# Patient Record
Sex: Male | Born: 1990 | Race: White | Hispanic: No | Marital: Married | Smoking: Never smoker
Health system: Southern US, Community
[De-identification: ages and names within clinical notes are randomized; demographics above are authoritative.]

## PROBLEM LIST (undated history)

## (undated) DIAGNOSIS — F988 Other specified behavioral and emotional disorders with onset usually occurring in childhood and adolescence: Secondary | ICD-10-CM

## (undated) HISTORY — DX: Other specified behavioral and emotional disorders with onset usually occurring in childhood and adolescence: F98.8

---

## 2013-07-04 ENCOUNTER — Ambulatory Visit: Payer: Self-pay | Admitting: Nurse Practitioner

## 2013-07-04 ENCOUNTER — Telehealth: Payer: Self-pay | Admitting: *Deleted

## 2013-07-04 NOTE — Telephone Encounter (Signed)
Appt given for tomorrow

## 2013-07-05 ENCOUNTER — Ambulatory Visit (INDEPENDENT_AMBULATORY_CARE_PROVIDER_SITE_OTHER): Payer: BC Managed Care – PPO | Admitting: Nurse Practitioner

## 2013-07-05 ENCOUNTER — Encounter: Payer: Self-pay | Admitting: Nurse Practitioner

## 2013-07-05 VITALS — BP 138/76 | HR 60 | Temp 98.6°F | Ht 72.0 in | Wt 234.0 lb

## 2013-07-05 DIAGNOSIS — K219 Gastro-esophageal reflux disease without esophagitis: Secondary | ICD-10-CM

## 2013-07-05 DIAGNOSIS — F411 Generalized anxiety disorder: Secondary | ICD-10-CM

## 2013-07-05 MED ORDER — OMEPRAZOLE 40 MG PO CPDR: 40.0000 mg | DELAYED_RELEASE_CAPSULE | Freq: Every day | ORAL | Status: AC

## 2013-07-05 MED ORDER — CITALOPRAM HYDROBROMIDE 20 MG PO TABS: 20.0000 mg | ORAL_TABLET | Freq: Every day | ORAL | Status: AC

## 2013-07-05 NOTE — Patient Instructions (Signed)
Stress Management Stress is a state of physical or mental tension that often results from changes in your life or normal routine. Some common causes of stress are:  Death of a loved one.  Injuries or severe illnesses.  Getting fired or changing jobs.  Moving into a new home. Other causes may be:  Sexual problems.  Business or financial losses.  Taking on a large debt.  Regular conflict with someone at home or at work.  Constant tiredness from lack of sleep. It is not just bad things that are stressful. It may be stressful to:  Win the lottery.  Get married.  Buy a new car. The amount of stress that can be easily tolerated varies from person to person. Changes generally cause stress, regardless of the types of change. Too much stress can affect your health. It may lead to physical or emotional problems. Too little stress (boredom) may also become stressful. SUGGESTIONS TO REDUCE STRESS:  Talk things over with your family and friends. It often is helpful to share your concerns and worries. If you feel your problem is serious, you may want to get help from a professional counselor.  Consider your problems one at a time instead of lumping them all together. Trying to take care of everything at once may seem impossible. List all the things you need to do and then start with the most important one. Set a goal to accomplish 2 or 3 things each day. If you expect to do too many in a single day you will naturally fail, causing you to feel even more stressed.  Do not use alcohol or drugs to relieve stress. Although you may feel better for a short time, they do not remove the problems that caused the stress. They can also be habit forming.  Exercise regularly - at least 3 times per week. Physical exercise can help to relieve that "uptight" feeling and will relax you.  The shortest distance between despair and hope is often a good night's sleep.  Go to bed and get up on time allowing  yourself time for appointments without being rushed.  Take a short "time-out" period from any stressful situation that occurs during the day. Close your eyes and take some deep breaths. Starting with the muscles in your face, tense them, hold it for a few seconds, then relax. Repeat this with the muscles in your neck, shoulders, hand, stomach, back and legs.  Take good care of yourself. Eat a balanced diet and get plenty of rest.  Schedule time for having fun. Take a break from your daily routine to relax. HOME CARE INSTRUCTIONS   Call if you feel overwhelmed by your problems and feel you can no longer manage them on your own.  Return immediately if you feel like hurting yourself or someone else. Document Released: 12/03/2000 Document Revised: 09/01/2011 Document Reviewed: 02/01/2013 [employer] Patient Information 2014 [employer].  

## 2013-07-05 NOTE — Progress Notes (Signed)
   Subjective:    Patient ID: Jim ClossCody Dennis, male    DOB: 08/03/1990, 23 y.o.   MRN: [id number]  HPI Patient here today to discuss several things: - acid reflux- started couple of weeks ago- happens everyday- wakes him up at night- lots of belching. No nausea or vomiting. - Patient says that his stress level is "out the roof". Says that it has changed his attitude and he angry easily and worries a lot more then he use to. Thinks that a lot of it is job related- it has caused problems with him falling asleep.     Review of Systems  Constitutional: Negative.   HENT: Negative.   Respiratory: Negative.   Cardiovascular: Negative.   Gastrointestinal: Negative.   Genitourinary: Negative.   Musculoskeletal: Negative.   All other systems reviewed and are negative.       Objective:   Physical Exam  Constitutional: He appears well-developed and well-nourished.  Cardiovascular: Normal rate, regular rhythm and normal heart sounds.   Pulmonary/Chest: Effort normal and breath sounds normal.  Musculoskeletal: Normal range of motion.  Skin: Skin is warm.  Psychiatric: He has a normal mood and affect. His behavior is normal. Judgment and thought content normal.   BP 138/76  Pulse 60  Temp(Src) 98.6 F (37 C) (Oral)  Ht 6' (1.829 m)  Wt 234 lb (106.142 kg)  BMI 31.73 kg/m2        Assessment & Plan:   1. GERD (gastroesophageal reflux disease)   2. GAD (generalized anxiety disorder)    Meds ordered this encounter  Medications  . omeprazole (PRILOSEC) 40 MG capsule    Sig: Take 1 capsule (40 mg total) by mouth daily.    Dispense:  30 capsule    Refill:  3    Order Specific Question:  Supervising Provider    Answer:  Ernestina PennaMOORE, DONALD W [[id number]]  . citalopram (CELEXA) 20 MG tablet    Sig: Take 1 tablet (20 mg total) by mouth daily.    Dispense:  30 tablet    Refill:  3    Order Specific Question:  Supervising Provider    Answer:  Ernestina PennaMOORE, DONALD W [[id number]]   Avoid spicy and fatty foods  for several days Do no eat 2 hours prior to bedtime Do not over eat Exercise Medication side effects discussed  Mary-Margaret Daphine DeutscherMartin, FNP

## 2013-07-05 NOTE — Addendum Note (Signed)
Addended by: Bennie PieriniMARTIN, MARY-MARGARET on: 07/05/2013 02:31 PM   Modules accepted: Orders

## 2013-07-07 ENCOUNTER — Telehealth: Payer: Self-pay | Admitting: Family Medicine

## 2013-07-07 LAB — H PYLORI, IGM, IGG, IGA AB
H Pylori IgG: <0.9 U/mL (ref 0.0–0.8)
H. pylori, IgA Abs: <9 units (ref 0.0–8.9)
H. pylori, IgM Abs: <9 units (ref 0.0–8.9)

## 2013-07-07 NOTE — Telephone Encounter (Signed)
Message copied by Azalee CourseFULP, ASHLEY on Thu Jul 07, 2013 11:49 AM ------      Message from: Bennie PieriniMARTIN, MARY-MARGARET      Created: Thu Jul 07, 2013  9:46 AM       h pylori negative ------

## 2014-12-08 DIAGNOSIS — F988 Other specified behavioral and emotional disorders with onset usually occurring in childhood and adolescence: Secondary | ICD-10-CM

## 2014-12-08 HISTORY — DX: Other specified behavioral and emotional disorders with onset usually occurring in childhood and adolescence: F98.8

## 2018-04-12 ENCOUNTER — Encounter: Payer: Self-pay | Admitting: Family Medicine

## 2018-04-15 ENCOUNTER — Encounter: Payer: Self-pay | Admitting: Family Medicine

## 2018-04-15 ENCOUNTER — Ambulatory Visit (INDEPENDENT_AMBULATORY_CARE_PROVIDER_SITE_OTHER): Payer: BC Managed Care – PPO

## 2018-04-15 ENCOUNTER — Telehealth: Payer: Self-pay | Admitting: Family Medicine

## 2018-04-15 ENCOUNTER — Ambulatory Visit (INDEPENDENT_AMBULATORY_CARE_PROVIDER_SITE_OTHER): Payer: BC Managed Care – PPO | Admitting: Family Medicine

## 2018-04-15 VITALS — BP 140/79 | HR 66 | Ht 72.0 in | Wt 245.0 lb

## 2018-04-15 DIAGNOSIS — M25511 Pain in right shoulder: Secondary | ICD-10-CM

## 2018-04-15 MED ORDER — DICLOFENAC SODIUM 1 % TD GEL: 4.0000 g | Freq: Four times a day (QID) | TRANSDERMAL | 11 refills | Status: AC

## 2018-04-15 NOTE — Patient Instructions (Addendum)
Thank you for coming in today. Attend PT.  You should hear from them soon.  Do the band exercises  Up to the front, up to the side, internal and external rotation 30 reps 2x daily.   Use diclofenac gel 4x daily for pain as needed.

## 2018-04-15 NOTE — Telephone Encounter (Signed)
Received fax from [facility] that Diclofenac Gel has been approved from 04-15-2018 through 04-15-2021. Form sent to scan. Pharmacy aware.

## 2018-04-15 NOTE — Progress Notes (Signed)
Jim Dennis is a 27 y.o. male who presents to [facility] today for right shoulder pain. Mr. Jim Dennis has had gradually worsening right shoulder pain for the past month, as well as pain in his biceps and triceps with certain movements. The pain is now waking him up at night. He does not recall an inciting event or any changes in his routine that would've caused the pain. He has tried ice and 800 mg Ibuprofen intermittently without consistent relief.  He has a history of right shoulder dislocation ~13 years ago from a football injury. He has played baseball and softball since high school. Currently, he can play softball, but towards the end of a game will start to have weakness. 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   ROS:  As above  Exam:  BP 140/79   Pulse 66   Ht 6' (1.829 m)   Wt 245 lb (111.1 kg)   BMI 33.23 kg/m  General: Well Developed, well nourished, and in no acute distress.  Neuro/Psych: Alert and oriented x3, extra-ocular muscles intact, able to move all 4 extremities, sensation grossly intact. Skin: Warm and dry, no rashes noted.  Respiratory: Not using accessory muscles, speaking in full sentences, trachea midline.  Cardiovascular: Pulses palpable, no extremity edema. Abdomen: Does not appear distended. MSK:  Right shoulder: Normal-appearing with no deformity. Range of motion full abduction external rotation and internal rotation Mildly tender to palpation near the [facility] joint.  Nontender otherwise. Strength is intact over patient does have some pain with resisted external rotation. Mildly positive Hawkins and Neer's test. Negative Yergason's and speeds tests. Mildly positive posterior apprehension test. Negative anterior apprehension test. Mild clunk with shoulder range of motion  Contralateral left shoulder normal-appearing nontender normal motion normal strength negative  impingement testing.  Cervical spine normal-appearing Nontender to spinal midline. Normal cervical range of motion. Upper extremity strength intact throughout. Upper extremity reflexes equal normal bilaterally. Pulses capillary refill and sensation are intact throughout.    Lab and Radiology Results X-ray images personally independently reviewed. No significant degenerative or acute changes. Await formal radiology review  Assessment and Plan: 27 y.o. male with  Right shoulder pain: Unclear etiology.  Mr. Jim Dennis most likely has a right shoulder labrum tear. Unlikely rotator cuff tendonitis due to negative exam. Right shoulder x-ray showed no abnormality. Plan for PT and advised pt to do band exercises. Prescribed Diclofenac gel 4x daily for pain as needed. Follow-up in 1 month.  If not better next step will be MRI arthrogram is at that point patient with failed 6-week trial of physician directed treatment.    Orders Placed This Encounter  Procedures  . DG Shoulder Right    Standing Status:   Future    Number of Occurrences:   1    Standing Expiration Date:   06/16/2019    Order Specific Question:   Reason for Exam (SYMPTOM  OR DIAGNOSIS REQUIRED)    Answer:   eval shoulder pain x1 month. Suspect laburm    Order Specific Question:   Preferred imaging location?    Answer:   [facility]    Order Specific Question:   Radiology Contrast Protocol - do NOT remove file path    Answer:   [email or web address]  . Ambulatory referral to Physical Therapy    Referral Priority:   Routine    Referral Type:   Physical Medicine    Referral Reason:  Specialty Services Required    Requested Specialty:   Physical Therapy    Number of Visits Requested:   1   Meds ordered this encounter  Medications  . diclofenac sodium (VOLTAREN) 1 % GEL    Sig: Apply 4 g topically 4 (four) times daily. To affected joint.    Dispense:  100 g    Refill:  11      Historical information moved to improve visibility of documentation.  Past Medical History:  Diagnosis Date  . ADD (attention deficit disorder) 12/08/2014   History reviewed. No pertinent surgical history. Social History   Tobacco Use  . Smoking status: Never Smoker  . Smokeless tobacco: Never Used  Substance Use Topics  . Alcohol use: No   family history includes Hyperlipidemia in his mother; Hypertension in his mother.  Medications: Current Outpatient Medications  Medication Sig Dispense Refill  . citalopram (CELEXA) 20 MG tablet Take 1 tablet (20 mg total) by mouth daily. 30 tablet 3  . omeprazole (PRILOSEC) 40 MG capsule Take 1 capsule (40 mg total) by mouth daily. 30 capsule 3  . diclofenac sodium (VOLTAREN) 1 % GEL Apply 4 g topically 4 (four) times daily. To affected joint. 100 g 11   No current facility-administered medications for this visit.    Not on File    Discussed warning signs or symptoms. Please see discharge instructions. Patient expresses understanding.   I personally was present and performed or re-performed the history, physical exam and medical decision-making activities of this service and have verified that the service and findings are accurately documented in the student's note. ___________________________________________ Clementeen Graham M.D., ABFM., CAQSM. Primary Care and Sports Medicine Adjunct Instructor of Family Medicine  [employer]

## 2018-04-16 ENCOUNTER — Encounter: Payer: Self-pay | Admitting: Family Medicine

## 2018-04-16 DIAGNOSIS — M25511 Pain in right shoulder: Secondary | ICD-10-CM | POA: Insufficient documentation

## 2019-04-04 IMAGING — DX DG SHOULDER 2+V*R*
3 series · 3 of 3 positions shown · non-contrast
Comparison: None.

CLINICAL DATA: Pain for months without trauma.

EXAM:
RIGHT SHOULDER - 2+ VIEW

[shoulder grashey]
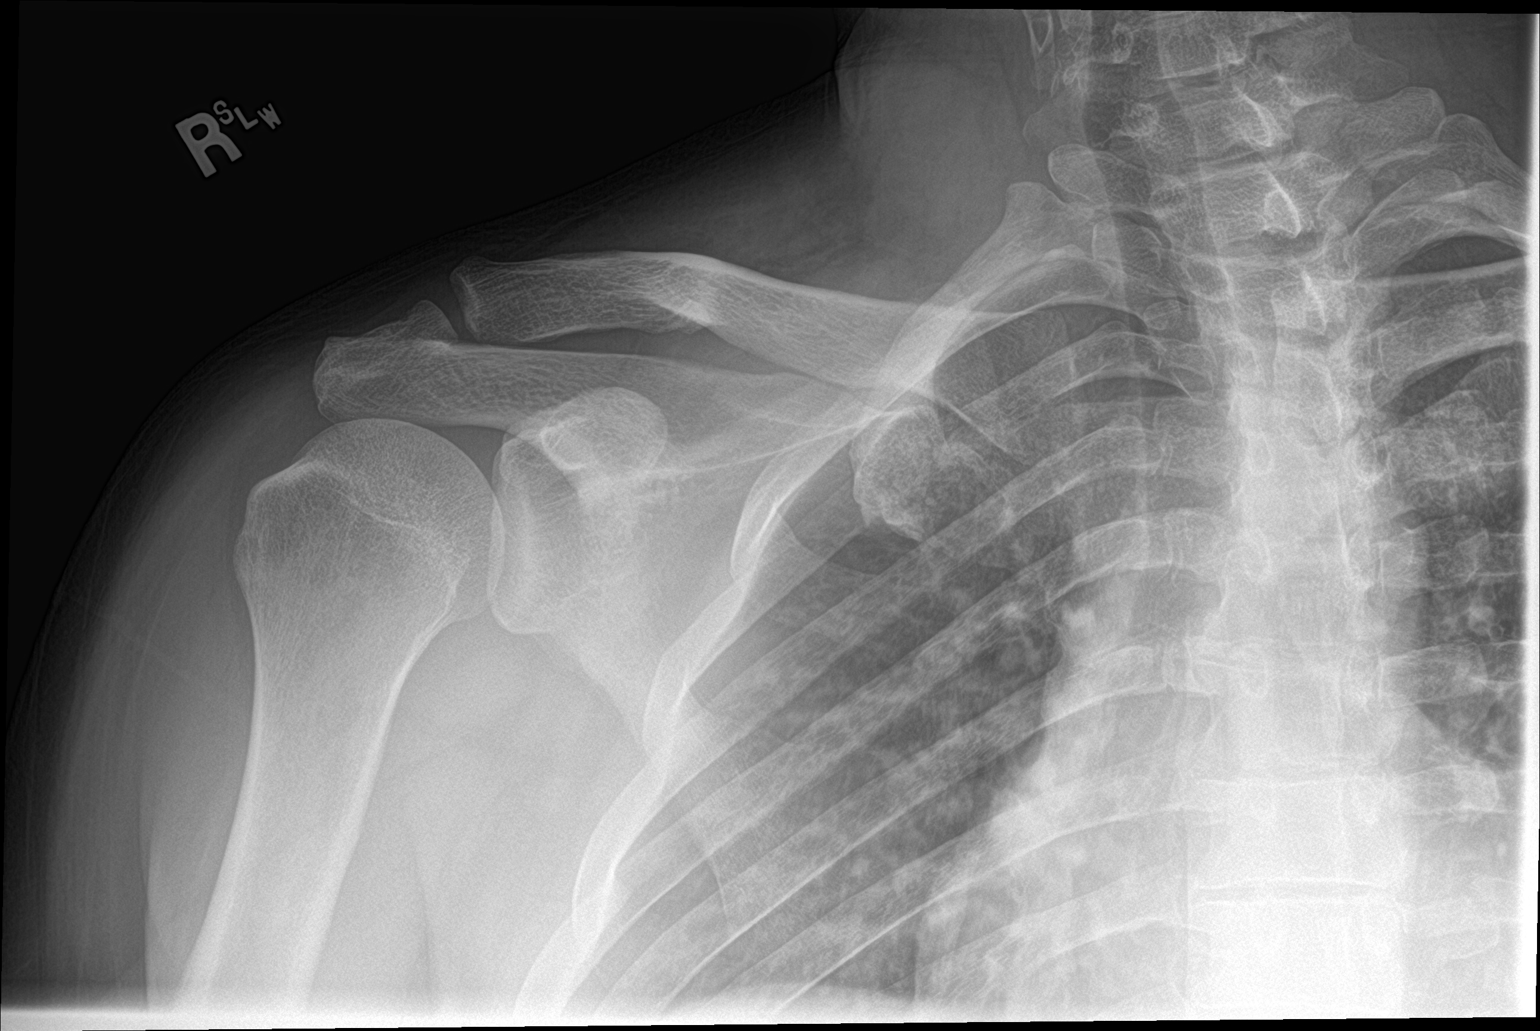

[shoulder y view]
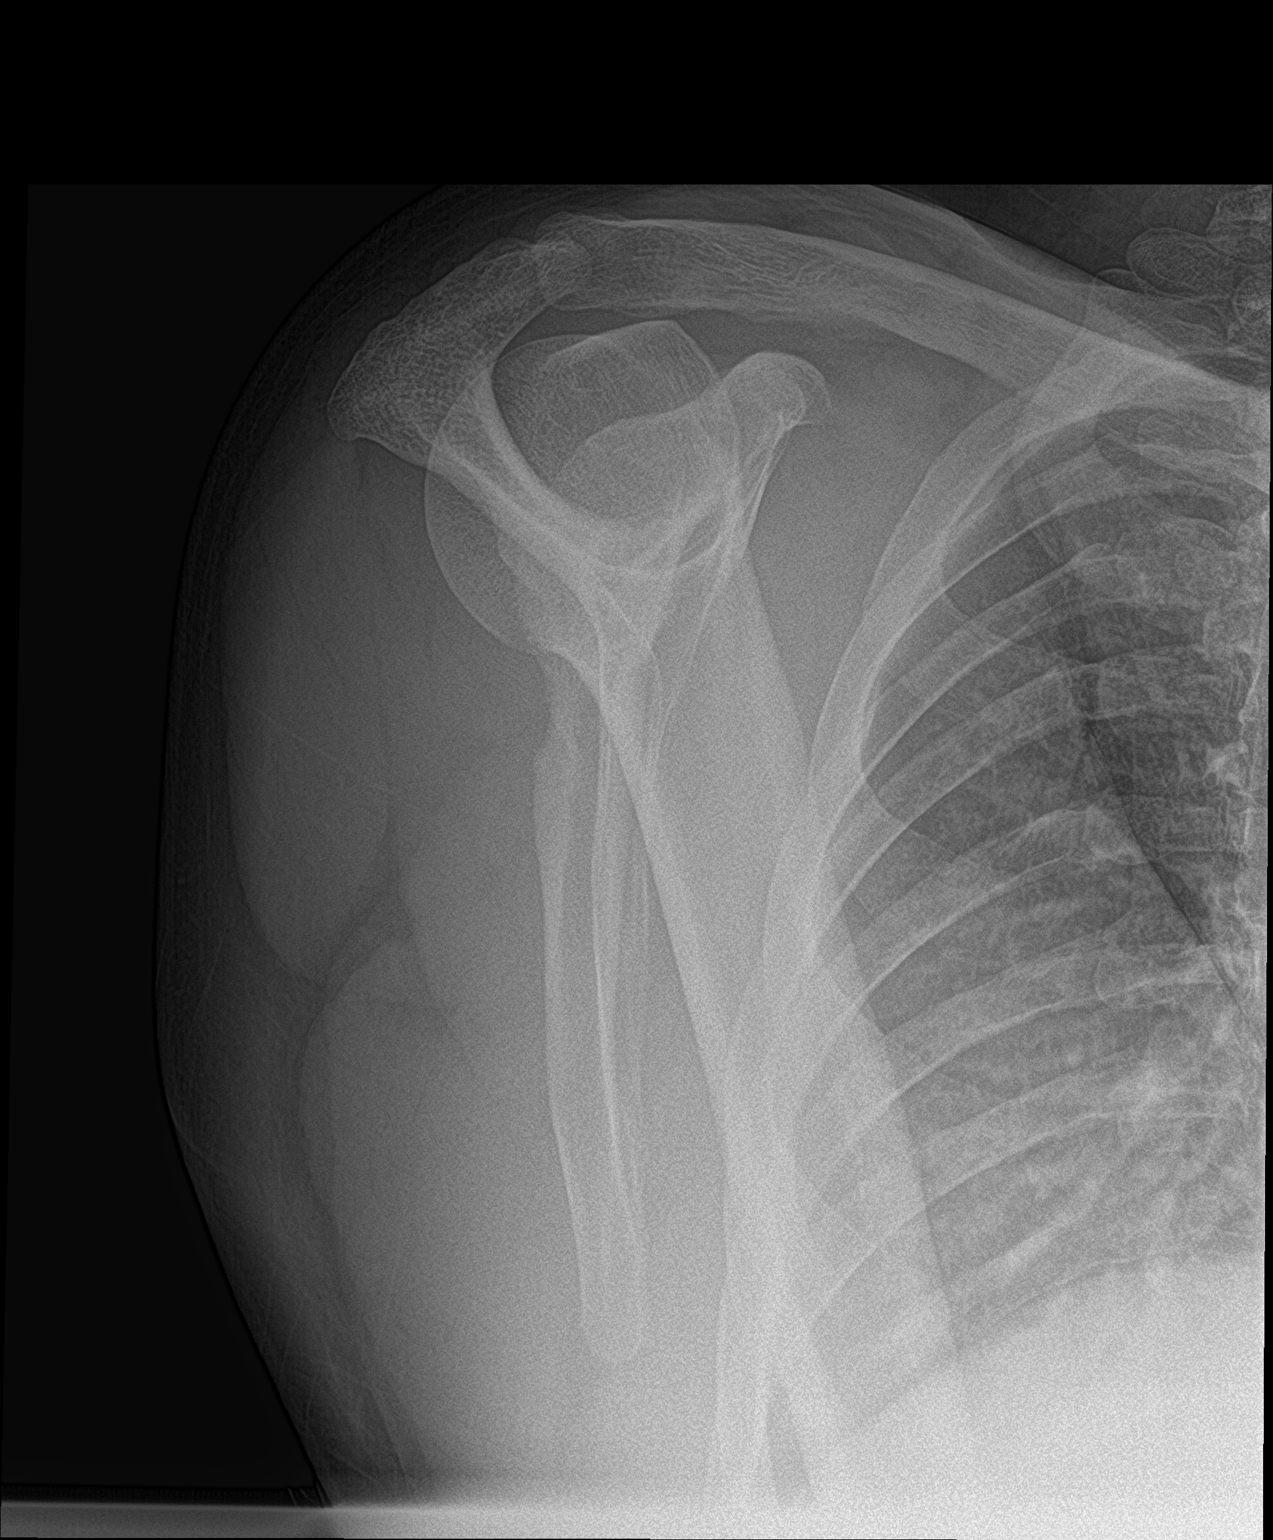

[shoulder axillary]
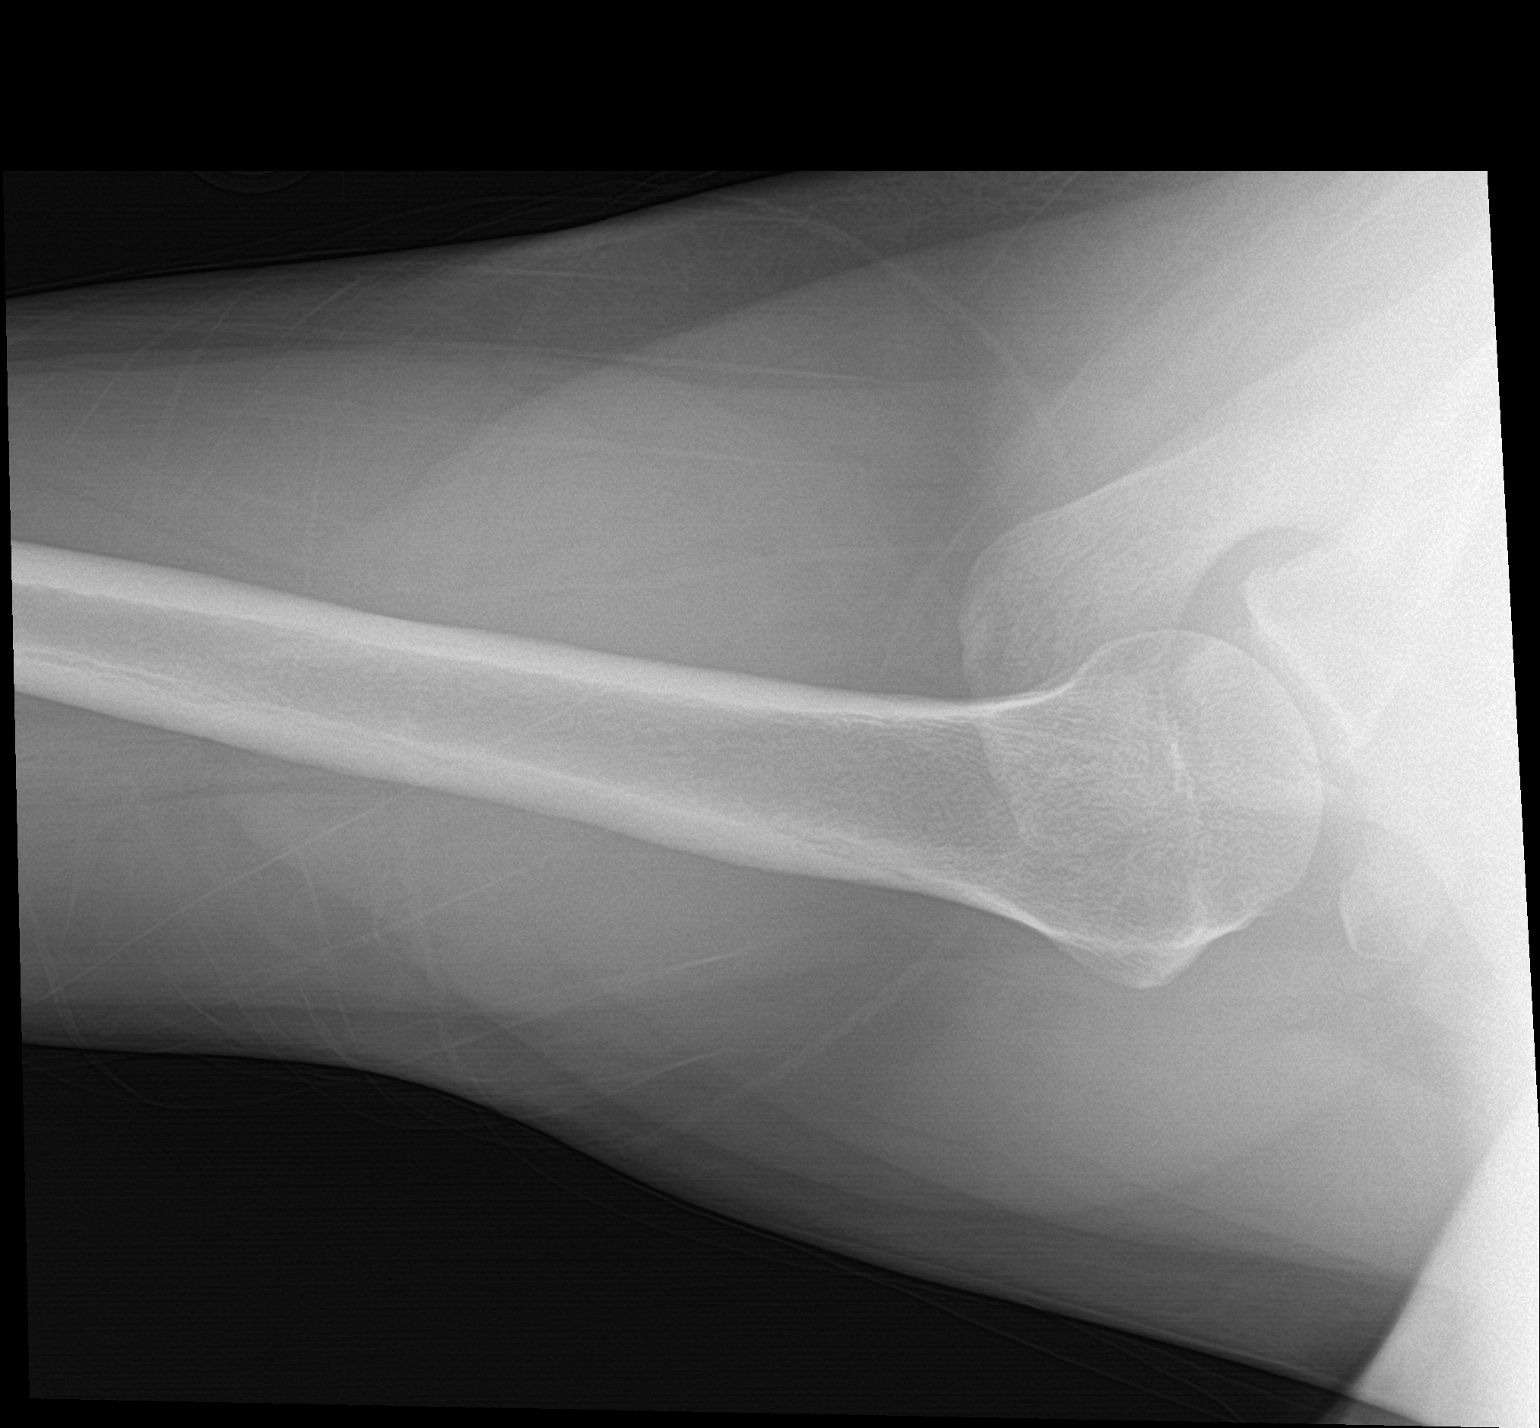

[3 of 3 positions shown; findings below may reference images not displayed]

FINDINGS: There is no evidence of fracture or dislocation. There is no
evidence of arthropathy or other focal bone abnormality. Soft
tissues are unremarkable.
IMPRESSION: Negative.
